# Patient Record
Sex: Female | Born: 1960 | Race: White | Hispanic: No | Marital: Married | Smoking: Never smoker
Health system: Southern US, Community
[De-identification: ages and names within clinical notes are randomized; demographics above are authoritative.]

---

## 1999-03-06 ENCOUNTER — Other Ambulatory Visit: Admission: RE | Admit: 1999-03-06 | Discharge: 1999-03-06 | Payer: Self-pay | Admitting: Obstetrics and Gynecology

## 1999-10-12 ENCOUNTER — Other Ambulatory Visit: Admission: RE | Admit: 1999-10-12 | Discharge: 1999-10-12 | Payer: Self-pay | Admitting: Obstetrics and Gynecology

## 2000-01-07 ENCOUNTER — Encounter: Payer: Self-pay | Admitting: Gastroenterology

## 2000-01-07 ENCOUNTER — Ambulatory Visit (HOSPITAL_COMMUNITY): Admission: RE | Admit: 2000-01-07 | Discharge: 2000-01-07 | Payer: Self-pay | Admitting: Gastroenterology

## 2000-03-10 ENCOUNTER — Ambulatory Visit (HOSPITAL_COMMUNITY): Admission: RE | Admit: 2000-03-10 | Discharge: 2000-03-10 | Payer: Self-pay | Admitting: Obstetrics and Gynecology

## 2000-03-21 ENCOUNTER — Other Ambulatory Visit: Admission: RE | Admit: 2000-03-21 | Discharge: 2000-03-21 | Payer: Self-pay | Admitting: Obstetrics and Gynecology

## 2001-03-24 ENCOUNTER — Other Ambulatory Visit: Admission: RE | Admit: 2001-03-24 | Discharge: 2001-03-24 | Payer: Self-pay | Admitting: *Deleted

## 2002-05-09 ENCOUNTER — Other Ambulatory Visit: Admission: RE | Admit: 2002-05-09 | Discharge: 2002-05-09 | Payer: Self-pay | Admitting: *Deleted

## 2002-05-18 ENCOUNTER — Observation Stay (HOSPITAL_COMMUNITY): Admission: RE | Admit: 2002-05-18 | Discharge: 2002-05-19 | Payer: Self-pay | Admitting: Orthopedic Surgery

## 2003-06-05 ENCOUNTER — Other Ambulatory Visit: Admission: RE | Admit: 2003-06-05 | Discharge: 2003-06-05 | Payer: Self-pay | Admitting: *Deleted

## 2009-12-12 ENCOUNTER — Encounter (INDEPENDENT_AMBULATORY_CARE_PROVIDER_SITE_OTHER): Payer: Self-pay | Admitting: *Deleted

## 2010-06-02 NOTE — Letter (Signed)
Summary: Colonoscopy Letter   Gastroenterology  [address] [place], [place] [address]   Phone: [phone]  Fax: [phone]      December 12, 2009 MRN: [id number]   [facility] [address] [place], [place]  [address]   Dear Ms. Vanalstine,   According to your medical record, it is time for you to schedule a Colonoscopy. The American Cancer Society recommends this procedure as a method to detect early colon cancer. Patients with a family history of colon cancer, or a personal history of colon polyps or inflammatory bowel disease are at increased risk.  This letter has beeen generated based on the recommendations made at the time of your procedure. If you feel that in your particular situation this may no longer apply, please contact our office.  Please call our office at [phone] to schedule this appointment or to update your records at your earliest convenience.  Thank you for cooperating with [place] to provide you with the very best care possible.   Sincerely,  Judie Petit T. Russella Dar, M.D.  [facility] Gastroenterology Division [phone]

## 2010-08-24 ENCOUNTER — Ambulatory Visit (INDEPENDENT_AMBULATORY_CARE_PROVIDER_SITE_OTHER): Payer: Self-pay

## 2010-08-24 ENCOUNTER — Inpatient Hospital Stay (INDEPENDENT_AMBULATORY_CARE_PROVIDER_SITE_OTHER)
Admission: RE | Admit: 2010-08-24 | Discharge: 2010-08-24 | Disposition: A | Discharge status: Home / Self Care | Payer: Self-pay | Source: Ambulatory Visit | Attending: Family Medicine | Admitting: Family Medicine

## 2010-08-24 DIAGNOSIS — S93609A Unspecified sprain of unspecified foot, initial encounter: Secondary | ICD-10-CM

## 2012-01-05 ENCOUNTER — Encounter: Payer: Self-pay | Admitting: Gastroenterology

## 2012-09-06 ENCOUNTER — Emergency Department (INDEPENDENT_AMBULATORY_CARE_PROVIDER_SITE_OTHER)
Admission: EM | Admit: 2012-09-06 | Discharge: 2012-09-06 | Disposition: A | Discharge status: Home / Self Care | Payer: Self-pay | Source: Home / Self Care | Attending: Family Medicine | Admitting: Family Medicine

## 2012-09-06 ENCOUNTER — Encounter (HOSPITAL_COMMUNITY): Payer: Self-pay

## 2012-09-06 ENCOUNTER — Emergency Department (INDEPENDENT_AMBULATORY_CARE_PROVIDER_SITE_OTHER): Payer: Self-pay

## 2012-09-06 DIAGNOSIS — S92919A Unspecified fracture of unspecified toe(s), initial encounter for closed fracture: Secondary | ICD-10-CM

## 2012-09-06 DIAGNOSIS — S92912A Unspecified fracture of left toe(s), initial encounter for closed fracture: Secondary | ICD-10-CM

## 2012-09-06 MED ORDER — TRAMADOL HCL 50 MG PO TABS: 50.0000 mg | ORAL_TABLET | Freq: Three times a day (TID) | ORAL | Status: AC | PRN

## 2012-09-06 MED ORDER — IBUPROFEN 600 MG PO TABS: 600.0000 mg | ORAL_TABLET | Freq: Three times a day (TID) | ORAL | Status: AC

## 2012-09-06 NOTE — ED Provider Notes (Signed)
History     CSN: [id number]  Arrival date & time 09/06/12  1346   First MD Initiated Contact with Patient 09/06/12 1524      Chief Complaint  Patient presents with  . Foot Injury    (Consider location/radiation/quality/duration/timing/severity/associated sxs/prior treatment) HPI Comments: 52 year old female with no significant past medical history. Comments complaining of left foot pain for 2 days. Patient reports she injured her left foot 2 days ago. She was feeding her couch and got up abruptly turning toward the left side scratching her left foot toes against the hard floor.  there was a deformity on her third toe that she was able to reduce herself. Area has been bruised and tender since. She's been using a rigid soled shoe she had from prior injury. Taking over-the-counter ibuprofen with some improvement. Denies numbness or paresthesias in her left foot.    History reviewed. No pertinent past medical history.  History reviewed. No pertinent past surgical history.  History reviewed. No pertinent family history.  History  Substance Use Topics  . Smoking status: Not on file  . Smokeless tobacco: Not on file  . Alcohol Use: Not on file    OB History   Grav Para Term Preterm Abortions TAB SAB Ect Mult Living                  Review of Systems  Constitutional: Negative for fever and chills.  Musculoskeletal:       Left foot injury as per HPI  Skin: Negative for wound.    Allergies  Review of patient's allergies indicates no known allergies.  Home Medications   Current Outpatient Rx  Name  Route  Sig  Dispense  Refill  . ibuprofen (ADVIL,MOTRIN) 600 MG tablet   Oral   Take 1 tablet (600 mg total) by mouth 3 (three) times daily.   30 tablet   0   . traMADol (ULTRAM) 50 MG tablet   Oral   Take 1 tablet (50 mg total) by mouth every 8 (eight) hours as needed for pain.   20 tablet   0     BP 148/84  Pulse 66  Temp(Src) 98.3 F (36.8 C) (Oral)  Resp 16  SpO2  98%  Physical Exam  Nursing note and vitals reviewed. Constitutional: She is oriented to person, place, and time. She appears well-developed and well-nourished. No distress.  HENT:  Head: Normocephalic and atraumatic.  Cardiovascular: Normal heart sounds.   Pulmonary/Chest: Breath sounds normal.  Musculoskeletal:  Left foot: there is bruising and mild swelling at dorsum of MPJ and proximal phalange at level of third toe. No swelling,  focal tenderness or deformity over tarsal or metatarsal bones. Patient able to wiggle flex and extend toes including third. No skin brakes or wounds. Entire left foot appears neurovascularly intact.   Neurological: She is alert and oriented to person, place, and time.  Skin: No rash noted. She is not diaphoretic.    ED Course  Procedures (including critical care time)  Labs Reviewed - No data to display Dg Foot Complete Left  09/06/2012  *RADIOLOGY REPORT*  Clinical Data: Deformity and bruising.  Recent dislocation of the toe.  LEFT FOOT - COMPLETE 3+ VIEW  Comparison: Three views of the left foot 08/24/2010.  Findings: An oblique fracture in the proximal phalanx of the third digit is minimally-displaced.  The PIP joint is located.  No additional fractures are evident.  The remainder of the foot is within normal limits.  IMPRESSION:  1.  Minimally-displaced fracture through the proximal phalanx in the third digit.   Original Report Authenticated By: Marin Roberts, M.D.      1. Phalanges fracture, foot, left, closed, initial encounter       MDM  Nondisplaced fracture of the proximal function of the third left toe. Body taping was performed here and recommended to continue to use rigid soled shoe for 4-6 weeks. Prescribed ibuprofen and tramadol. Asked to followup with orthopedic specialist and contact information was provided.        Sharin Grave, MD 09/08/12 1005

## 2012-09-06 NOTE — ED Notes (Signed)
States she injured her left foot couple of days ago,and self treated by reducing deformity , ice, elevation, wooden shoe (from prior injury), motrin. Concern for swelling , echymosis , as her job requires her to be up on her feet all the time

## 2016-02-06 ENCOUNTER — Emergency Department (HOSPITAL_COMMUNITY): Payer: Worker's Compensation

## 2016-02-06 ENCOUNTER — Encounter (HOSPITAL_COMMUNITY): Payer: Self-pay

## 2016-02-06 ENCOUNTER — Emergency Department (HOSPITAL_COMMUNITY)
Admission: EM | Admit: 2016-02-06 | Discharge: 2016-02-06 | Disposition: A | Discharge status: Home / Self Care | Payer: Worker's Compensation | Attending: Emergency Medicine | Admitting: Emergency Medicine

## 2016-02-06 DIAGNOSIS — S52612A Displaced fracture of left ulna styloid process, initial encounter for closed fracture: Secondary | ICD-10-CM | POA: Insufficient documentation

## 2016-02-06 DIAGNOSIS — Y9389 Activity, other specified: Secondary | ICD-10-CM | POA: Insufficient documentation

## 2016-02-06 DIAGNOSIS — Y990 Civilian activity done for income or pay: Secondary | ICD-10-CM | POA: Diagnosis not present

## 2016-02-06 DIAGNOSIS — Y929 Unspecified place or not applicable: Secondary | ICD-10-CM | POA: Insufficient documentation

## 2016-02-06 DIAGNOSIS — S52501A Unspecified fracture of the lower end of right radius, initial encounter for closed fracture: Secondary | ICD-10-CM | POA: Diagnosis not present

## 2016-02-06 DIAGNOSIS — W109XXA Fall (on) (from) unspecified stairs and steps, initial encounter: Secondary | ICD-10-CM | POA: Insufficient documentation

## 2016-02-06 DIAGNOSIS — S62101A Fracture of unspecified carpal bone, right wrist, initial encounter for closed fracture: Secondary | ICD-10-CM

## 2016-02-06 DIAGNOSIS — S6991XA Unspecified injury of right wrist, hand and finger(s), initial encounter: Secondary | ICD-10-CM | POA: Diagnosis present

## 2016-02-06 MED ORDER — MORPHINE SULFATE (PF) 4 MG/ML IV SOLN
4.0000 mg | Freq: Once | INTRAVENOUS | Status: AC
Start: 2016-02-06 — End: 2016-02-06
  Administered 2016-02-06: 4 mg via INTRAVENOUS
  Filled 2016-02-06: qty 1

## 2016-02-06 MED ORDER — ONDANSETRON HCL 4 MG/2ML IJ SOLN
4.0000 mg | Freq: Once | INTRAMUSCULAR | Status: AC
  Administered 2016-02-06: 4 mg via INTRAVENOUS
  Filled 2016-02-06: qty 2

## 2016-02-06 MED ORDER — HYDROCODONE-ACETAMINOPHEN 5-325 MG PO TABS: 2.0000 | ORAL_TABLET | ORAL | 0 refills | Status: AC | PRN

## 2016-02-06 NOTE — ED Triage Notes (Addendum)
Pt presents to ED c/o R wrist pain and injury that occurred ~30 mins. Pt reports she fell down steps and caught herself on her R wrist. Pt also reports R shoulder and knee pain. Deformity noted to R wrist. R radial pulse 2+, cap refill < 3 secs. Sensation intact.

## 2016-02-06 NOTE — Discharge Instructions (Signed)
Call Dr. Mina MarbleWeingold for follow up appointment.

## 2016-02-06 NOTE — ED Provider Notes (Signed)
[facility]-EMERGENCY DEPT Provider Note   CSN: [id number] Arrival date & time: 02/06/16  0865     History   Chief Complaint Chief Complaint  Patient presents with  . Wrist Pain  . Arm Injury    HPI Wanda Cook is a 55 y.o. female. She presents for evaluation of a wrist injury. She was at work. She was walking down 3 steps. When from the second to the last step she tripped and fell forward. She fell onto her knees and an outstretched right hand. Planes of pain at the right wrist. Initially complained of pain to elbow and shoulder. Now complains only of pain and swelling at the wrist  HPI  History reviewed. No pertinent past medical history.  There are no active problems to display for this patient.   History reviewed. No pertinent surgical history.  OB History    No data available       Home Medications    Prior to Admission medications   Medication Sig Start Date End Date Taking? Authorizing Provider  HYDROcodone-acetaminophen (NORCO/VICODIN) 5-325 MG tablet Take 2 tablets by mouth every 4 (four) hours as needed. 02/06/16   Rolland Porter, MD  ibuprofen (ADVIL,MOTRIN) 600 MG tablet Take 1 tablet (600 mg total) by mouth 3 (three) times daily. 09/06/12   Adlih Moreno-Coll, MD  traMADol (ULTRAM) 50 MG tablet Take 1 tablet (50 mg total) by mouth every 8 (eight) hours as needed for pain. 09/06/12   Sharin Grave, MD    Family History No family history on file.  Social History Social History  Substance Use Topics  . Smoking status: Never Smoker  . Smokeless tobacco: Never Used  . Alcohol use 1.2 oz/week    2 Glasses of wine per week     Allergies   Sulfa antibiotics   Review of Systems Review of Systems  Constitutional: Negative for appetite change, chills, diaphoresis, fatigue and fever.  HENT: Negative for mouth sores, sore throat and trouble swallowing.   Eyes: Negative for visual disturbance.  Respiratory: Negative for cough, chest tightness, shortness of  breath and wheezing.   Cardiovascular: Negative for chest pain.  Gastrointestinal: Negative for abdominal distention, abdominal pain, diarrhea, nausea and vomiting.  Endocrine: Negative for polydipsia, polyphagia and polyuria.  Genitourinary: Negative for dysuria, frequency and hematuria.  Musculoskeletal: Negative for gait problem.       Right wrist pain  Skin: Negative for color change, pallor and rash.  Neurological: Negative for dizziness, syncope, light-headedness and headaches.  Hematological: Does not bruise/bleed easily.  Psychiatric/Behavioral: Negative for behavioral problems and confusion.     Physical Exam Updated Vital Signs BP 155/79   Pulse 60   Temp 98.1 F (36.7 C) (Oral)   Resp 18   Ht 5\' 3"  (1.6 m)   Wt 130 lb (59 kg)   SpO2 100%   BMI 23.03 kg/m   Physical Exam  Constitutional: She is oriented to person, place, and time. She appears well-developed and well-nourished. No distress.  HENT:  Head: Normocephalic.  Eyes: Conjunctivae are normal. Pupils are equal, round, and reactive to light. No scleral icterus.  Neck: Normal range of motion. Neck supple. No thyromegaly present.  Cardiovascular: Normal rate and regular rhythm.  Exam reveals no gallop and no friction rub.   No murmur heard. Pulmonary/Chest: Effort normal and breath sounds normal. No respiratory distress. She has no wheezes. She has no rales.  Abdominal: Soft. Bowel sounds are normal. She exhibits no distension. There is no tenderness. There  is no rebound.  Musculoskeletal: Normal range of motion.       Arms: Neurological: She is alert and oriented to person, place, and time.  Skin: Skin is warm and dry. No rash noted.  Psychiatric: She has a normal mood and affect. Her behavior is normal.     ED Treatments / Results  Labs (all labs ordered are listed, but only abnormal results are displayed) Labs Reviewed - No data to display  EKG  EKG Interpretation None       Radiology Dg  Shoulder Right  Result Date: 02/06/2016 CLINICAL DATA:  Fall. EXAM: RIGHT SHOULDER - 2+ VIEW COMPARISON:  No recent . FINDINGS: Acromioclavicular and coracoclavicular separation cannot be excluded. AP views of both shoulders with without weights can be obtained for further evaluation . IMPRESSION: Acromioclavicular coracoid clavicular separation cannot be excluded. AP views of both shoulders with without weights can be obtained. No fracture or dislocation. Electronically Signed   By: Maisie Fushomas  Register   On: 02/06/2016 11:03   Dg Elbow Complete Right  Result Date: 02/06/2016 CLINICAL DATA:  Follow-up EXAM: RIGHT ELBOW - COMPLETE 3+ VIEW COMPARISON:  No recent prior. FINDINGS: No acute bony or joint abnormality identified. No evidence of fracture or dislocation. IMPRESSION: No acute or focal abnormality. Electronically Signed   By: Maisie Fushomas  Register   On: 02/06/2016 11:04   Dg Wrist Complete Right  Result Date: 02/06/2016 CLINICAL DATA:  Fall. EXAM: RIGHT WRIST - COMPLETE 3+ VIEW COMPARISON:  No recent prior. FINDINGS: Slightly displaced and angulated fractures of distal right radius with extension into the radiocarpal joint space noted. Slight displaced ulnar styloid fracture is noted. No other focal abnormality identified . IMPRESSION: 1. Slightly displaced and angulated fractures of the distal radius. Extension into the radiocarpal joint space noted. 2. Slight displaced fracture of the ulnar styloid. Electronically Signed   By: Maisie Fushomas  Register   On: 02/06/2016 11:02    Procedures Procedures (including critical care time)  Medications Ordered in ED Medications  ondansetron (ZOFRAN) injection 4 mg (4 mg Intravenous Given 02/06/16 1106)  morphine 4 MG/ML injection 4 mg (4 mg Intravenous Given 02/06/16 1107)     Initial Impression / Assessment and Plan / ED Course  I have reviewed the triage vital signs and the nursing notes.  Pertinent labs & imaging results that were available during my care of  the patient were reviewed by me and considered in my medical decision making (see chart for details).  Clinical Course    X-ray show a nondisplaced right distal radius and ulnar fracture. Placed in a coaptation splint. Placed in a sling. Discharged home. Has surgical follow-up. Vicodin for pain. Ice and elevate.  Final Clinical Impressions(s) / ED Diagnoses   Final diagnoses:  Closed fracture of right wrist, initial encounter    New Prescriptions Discharge Medication List as of 02/06/2016 11:31 AM    START taking these medications   Details  HYDROcodone-acetaminophen (NORCO/VICODIN) 5-325 MG tablet Take 2 tablets by mouth every 4 (four) hours as needed., Starting Fri 02/06/2016, Print         Rolland PorterMark Cornie Mccomber, MD 02/06/16 [phone]

## 2017-07-27 IMAGING — CR DG ELBOW COMPLETE 3+V*R*
4 series · 4 of 4 positions shown · non-contrast
Comparison: No recent prior.

CLINICAL DATA: Follow-up

EXAM:
RIGHT ELBOW - COMPLETE 3+ VIEW

[x elbow obl right (1 of 2)]
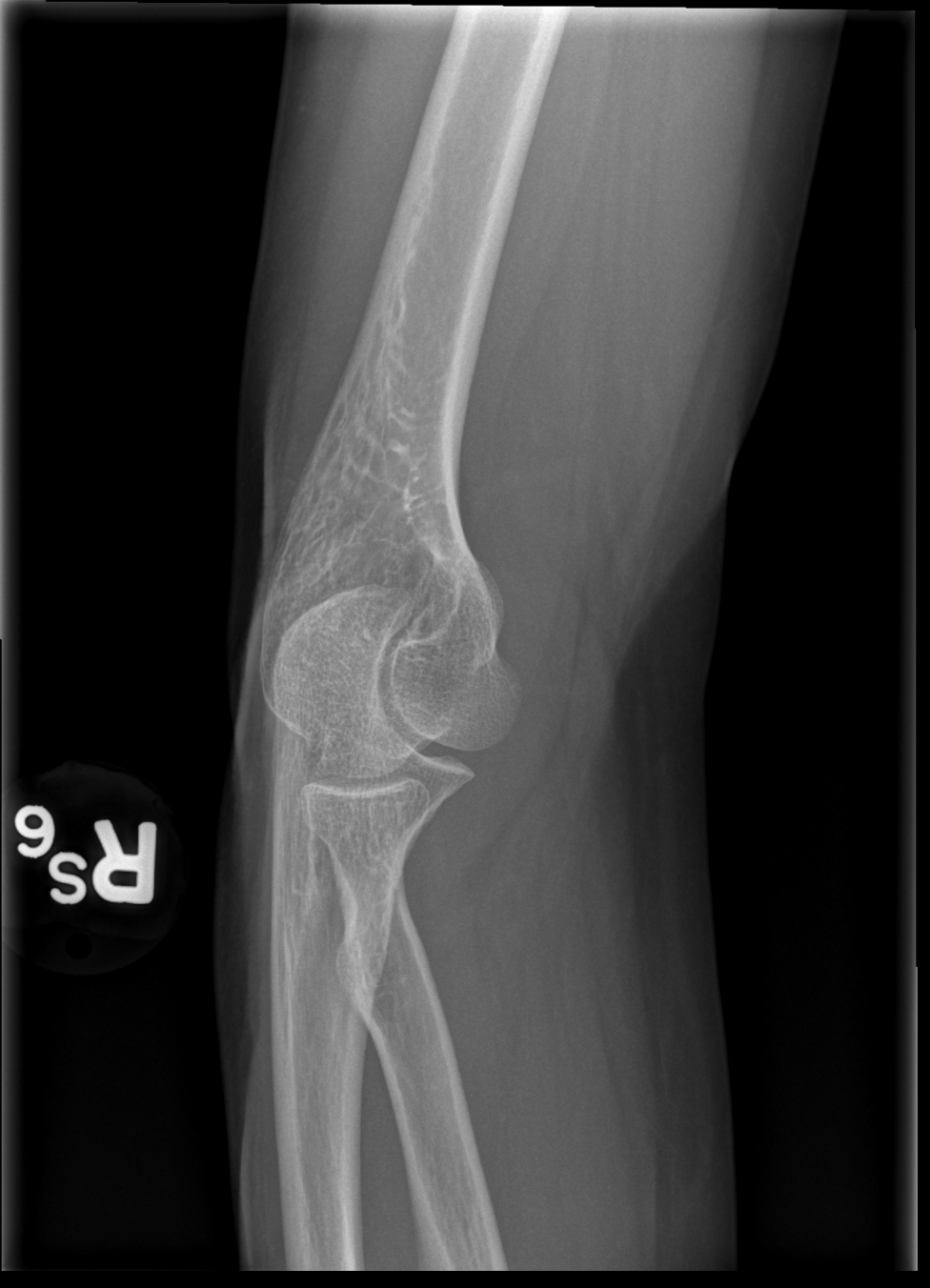

[x elbow ap right]
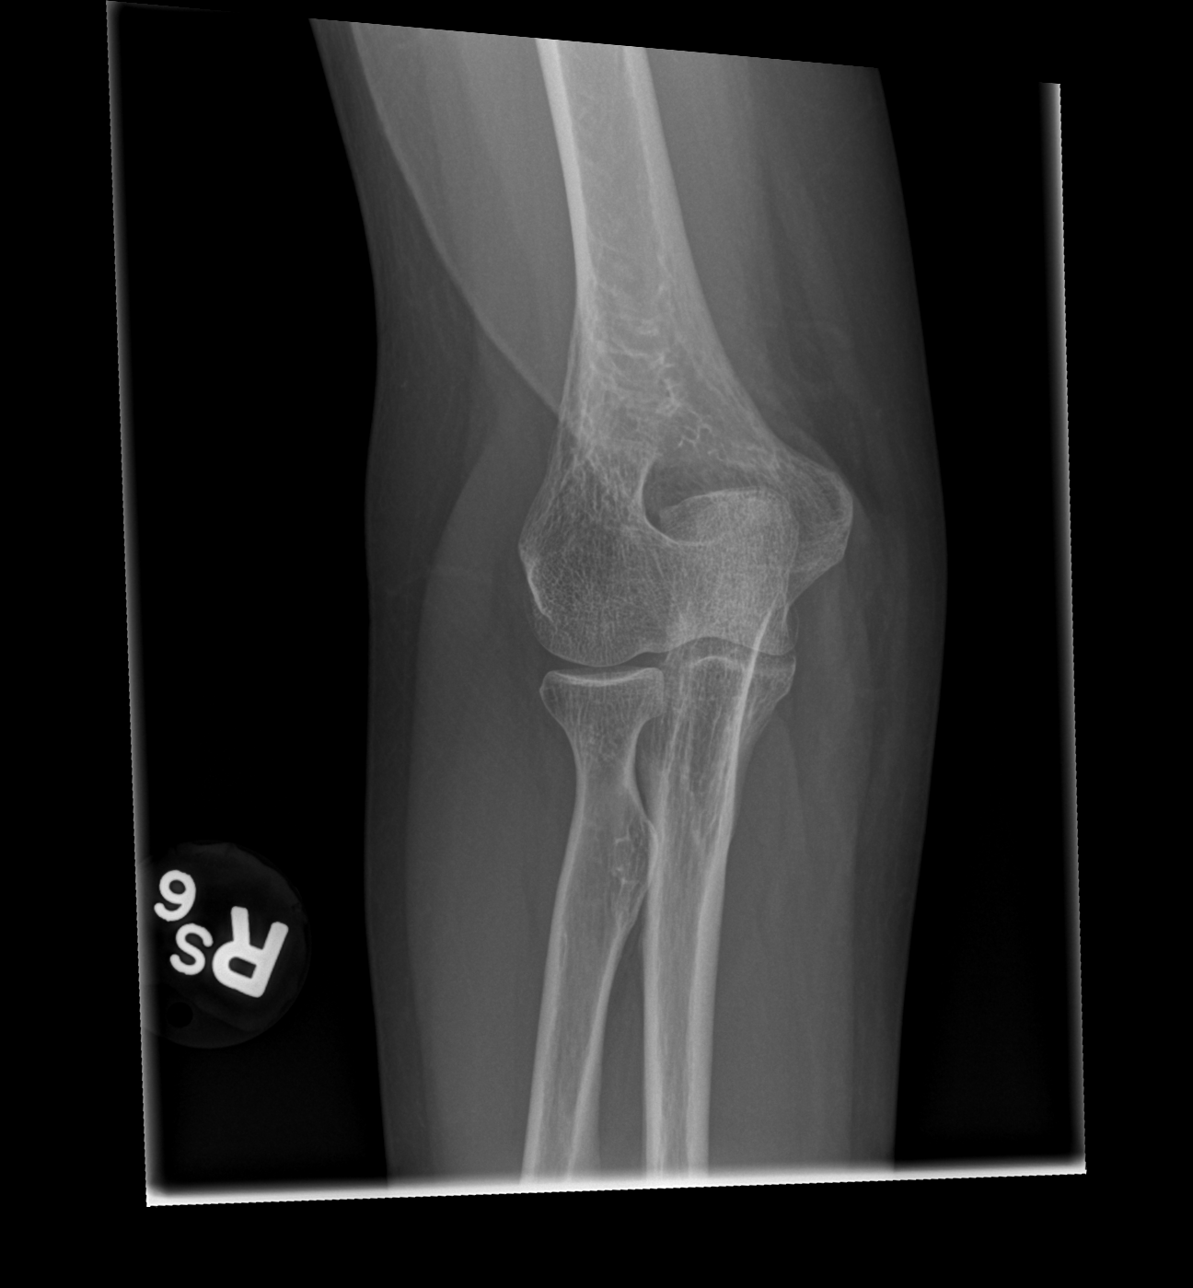

[x elbow obl right (2 of 2)]
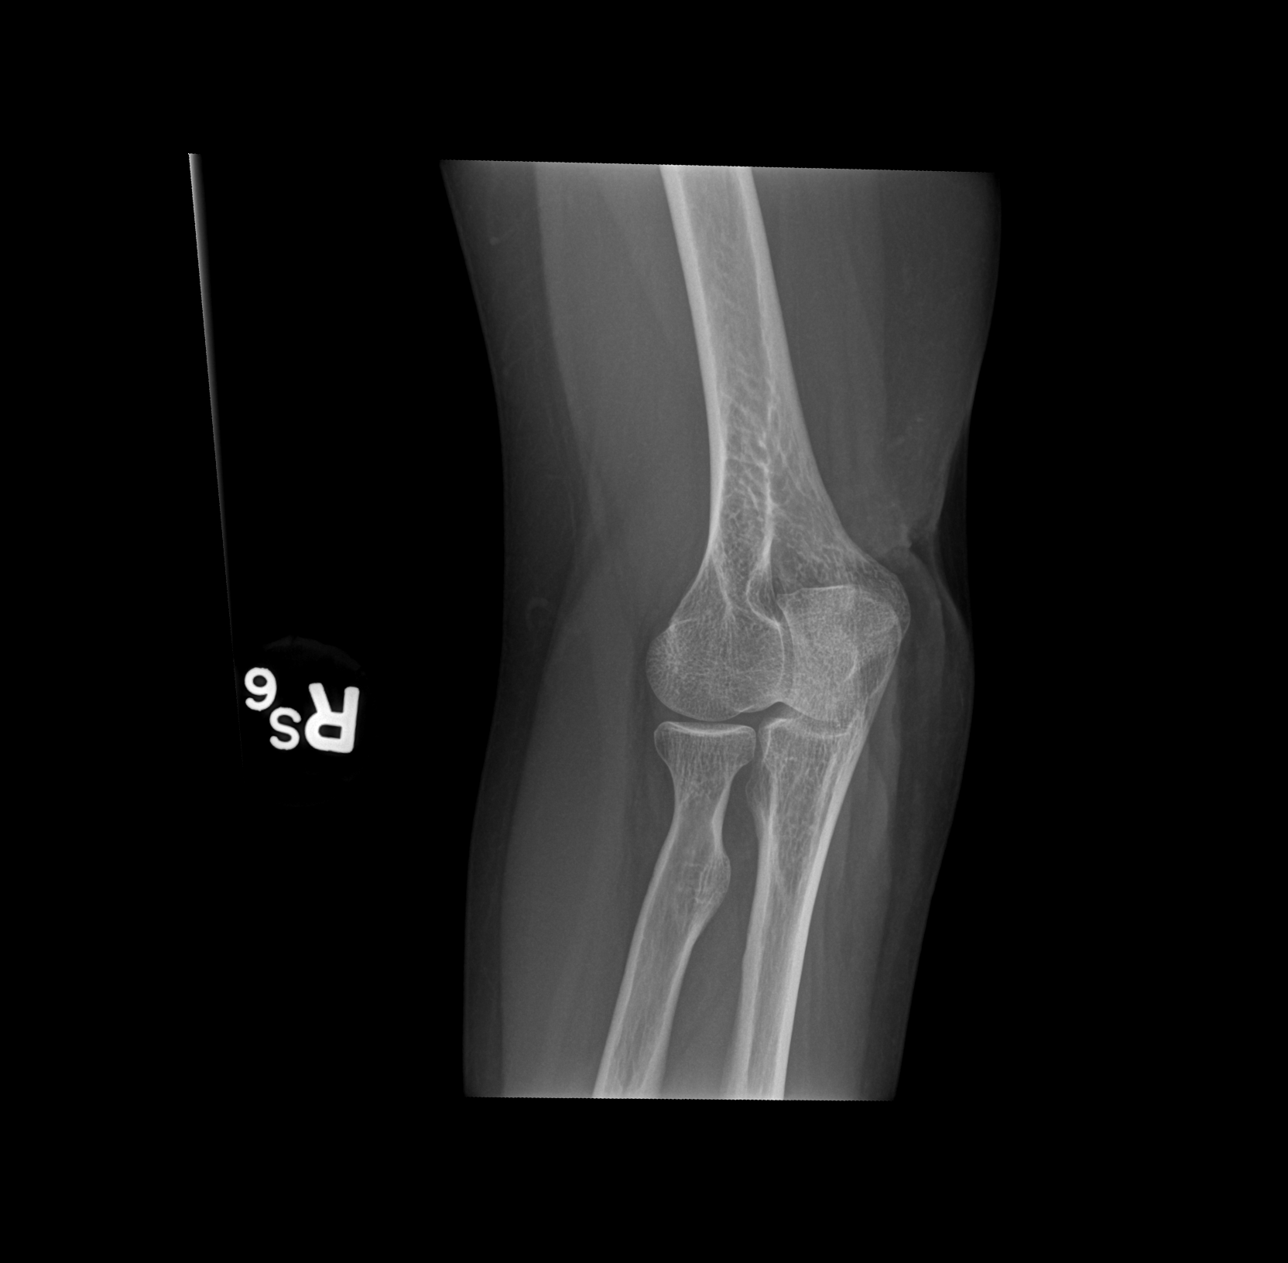

[x elbow lat right]
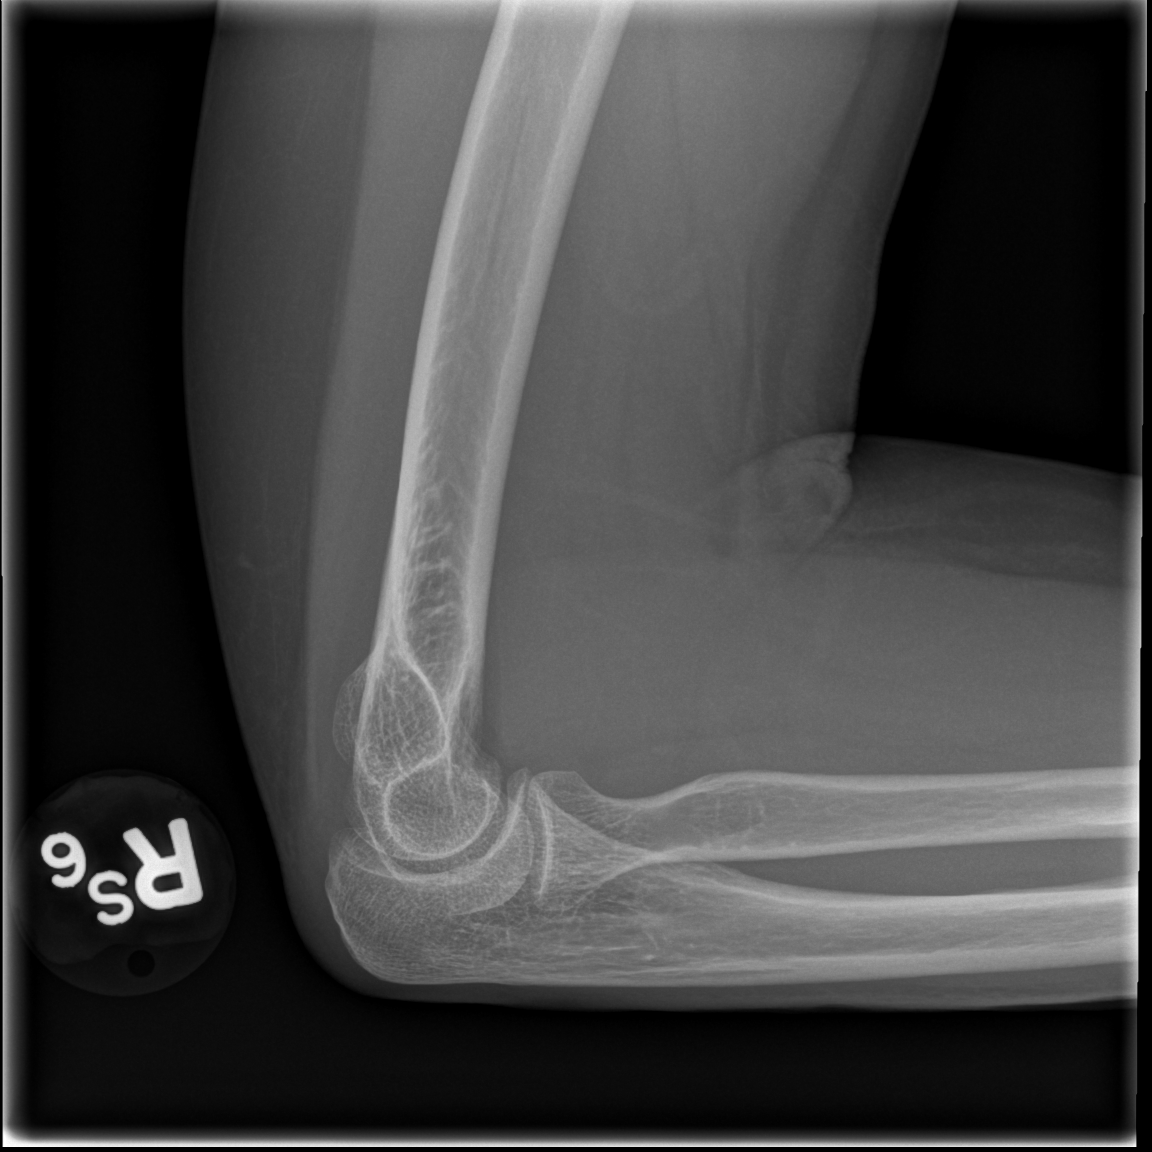

[4 of 4 positions shown; findings below may reference images not displayed]

FINDINGS: No acute bony or joint abnormality identified. No evidence of
fracture or dislocation.
IMPRESSION: No acute or focal abnormality.

## 2017-07-27 IMAGING — CR DG WRIST COMPLETE 3+V*R*
4 series · 4 of 4 positions shown · non-contrast
Comparison: No recent prior.

CLINICAL DATA: Fall.

EXAM:
RIGHT WRIST - COMPLETE 3+ VIEW

[x wrist pa right]
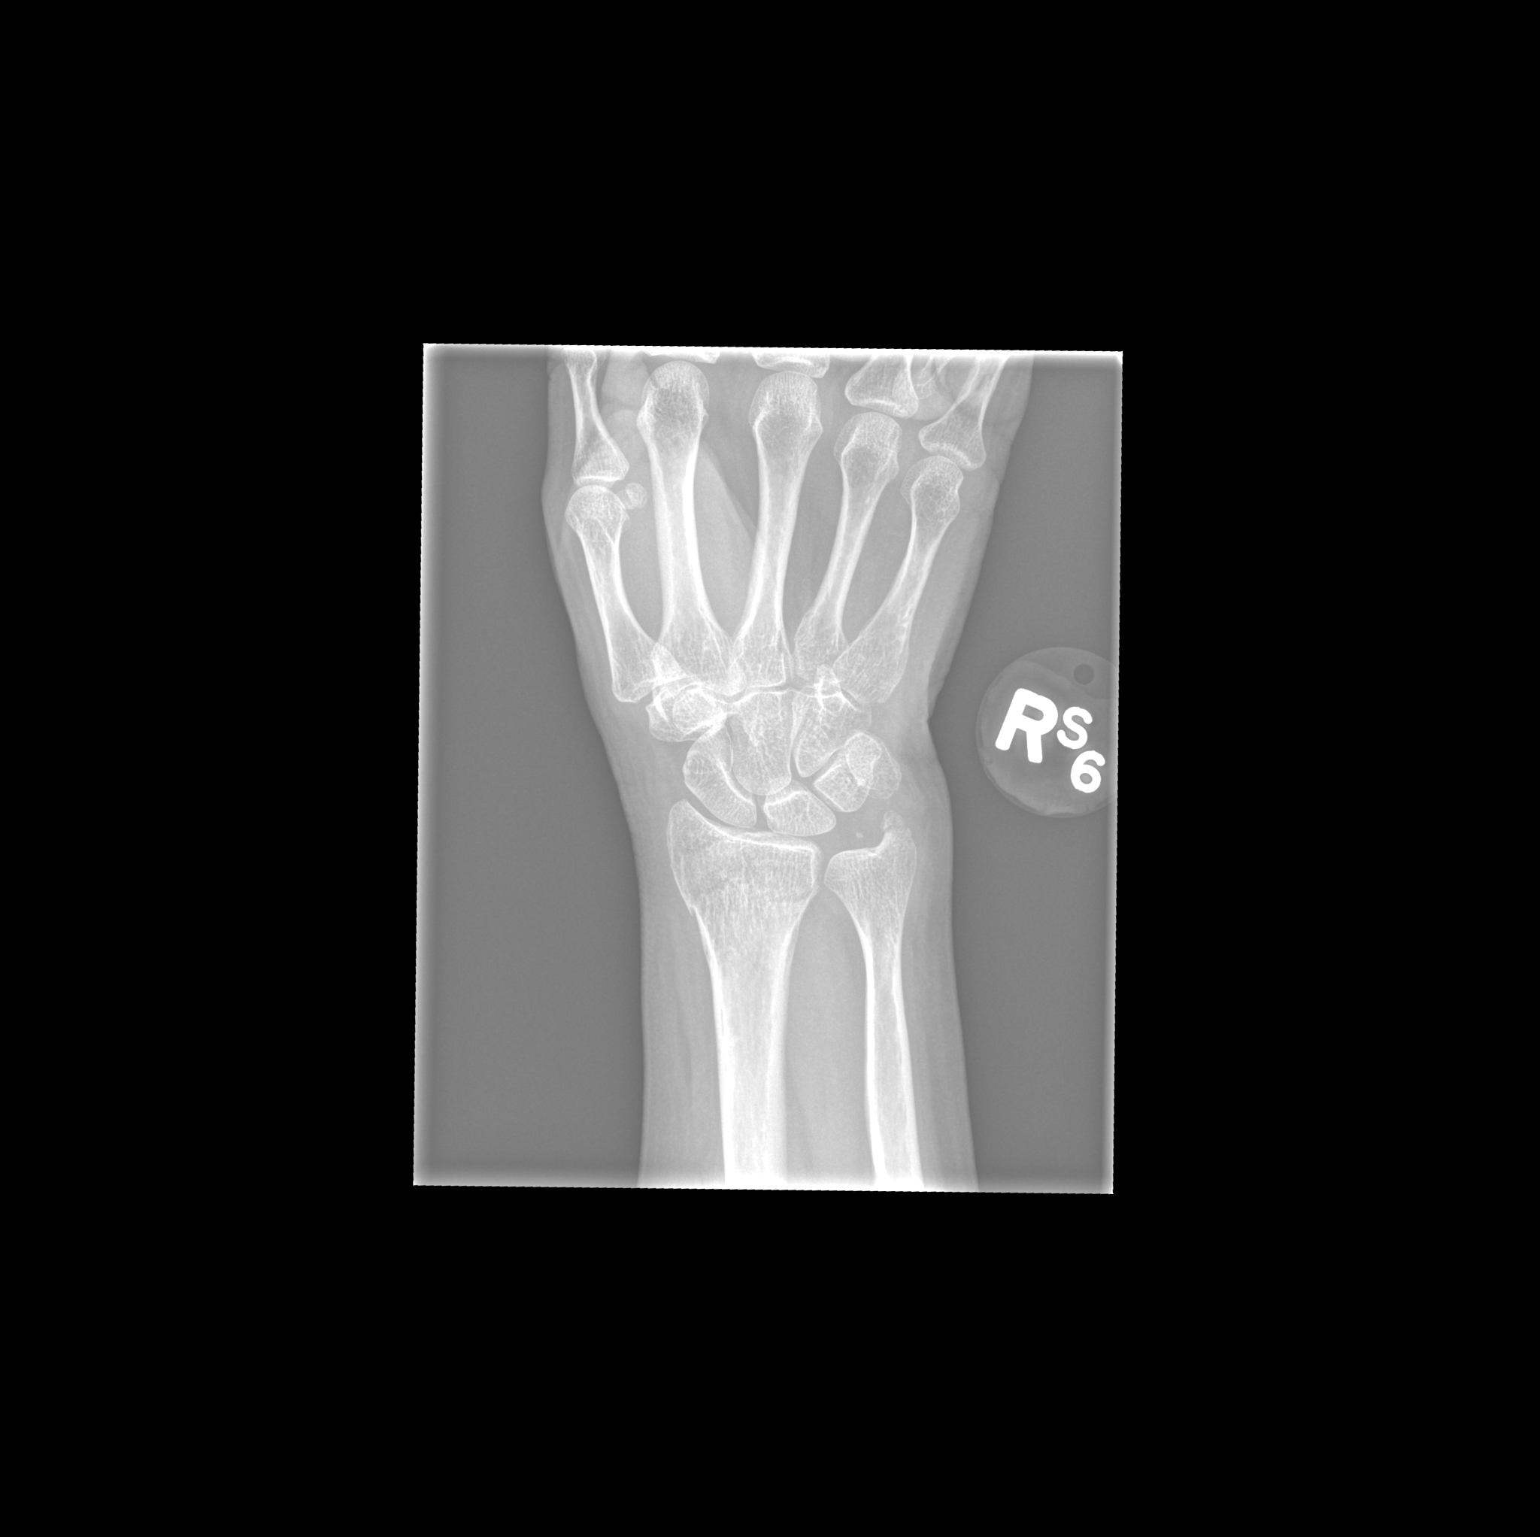

[x wrist obl right]
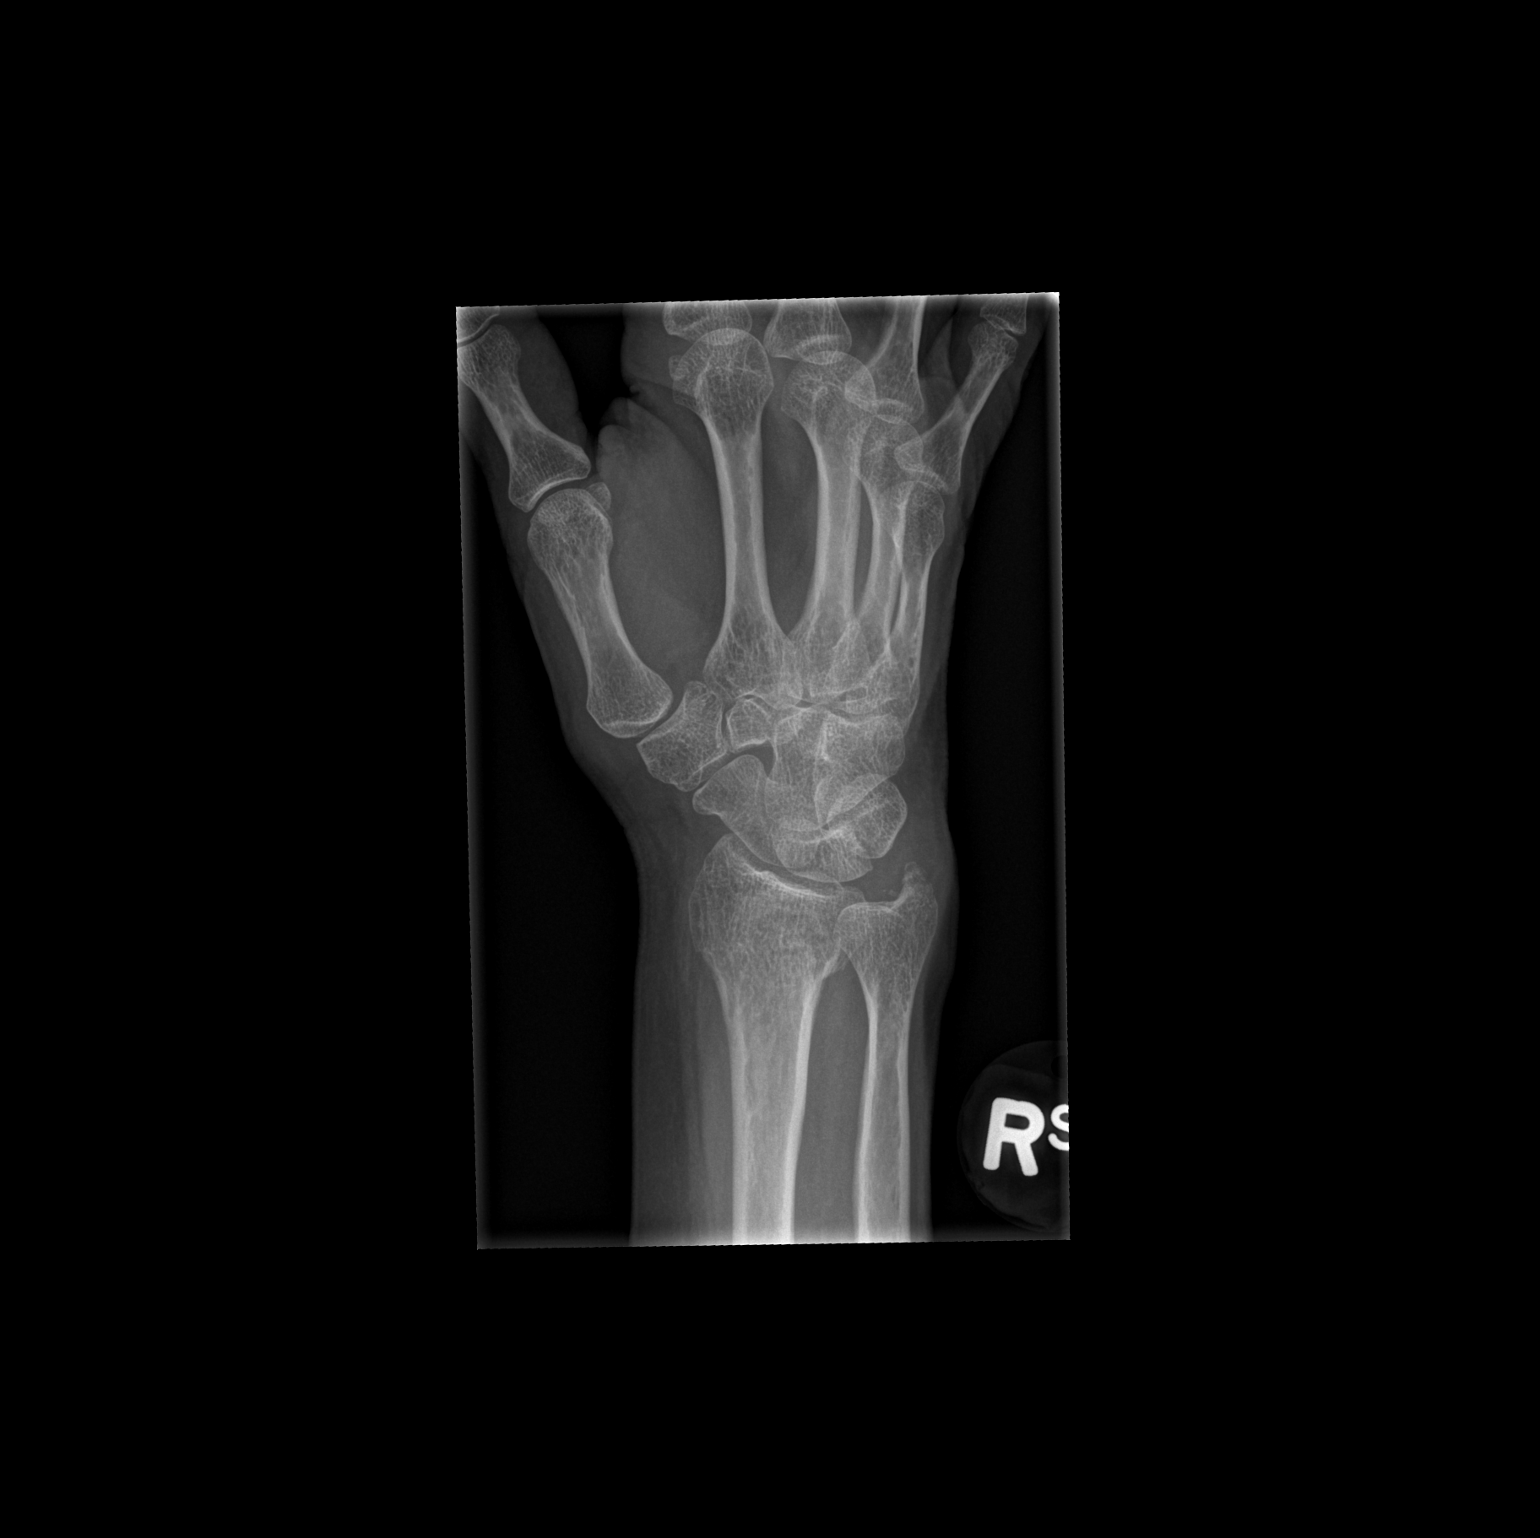

[x wrist lat right]
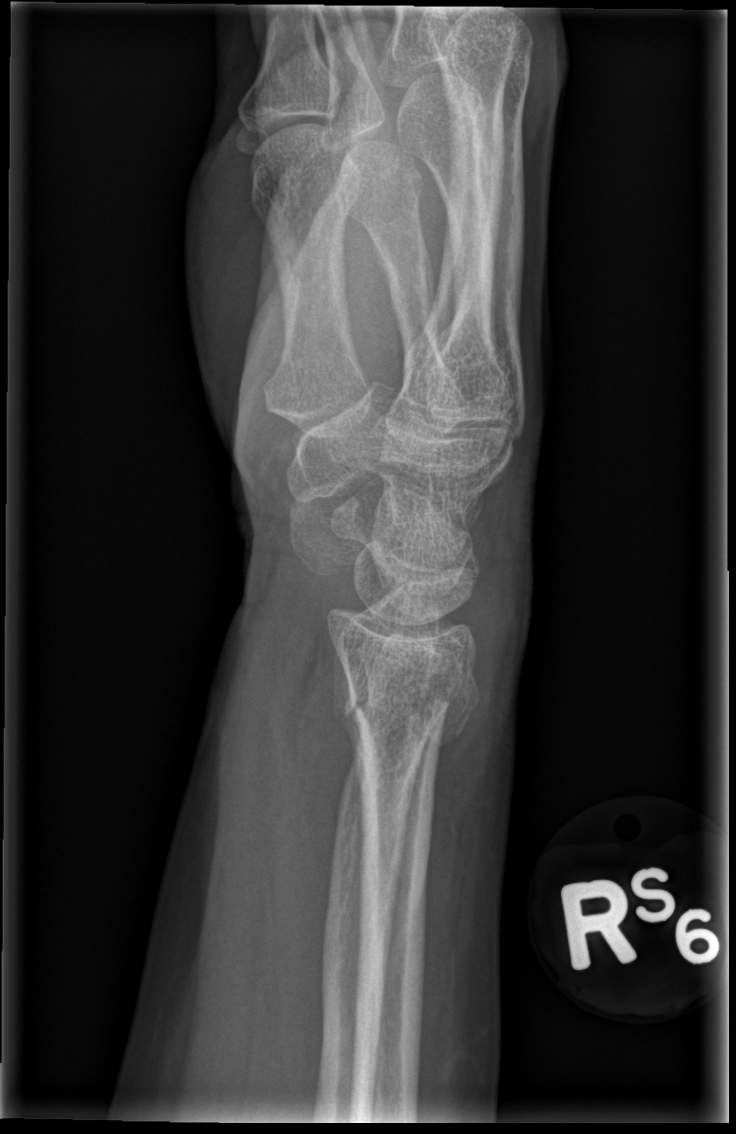

[x wrist navicular view right]
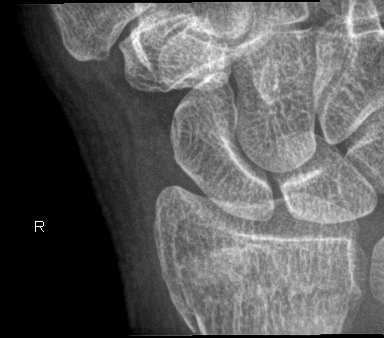

[4 of 4 positions shown; findings below may reference images not displayed]

FINDINGS: Slightly displaced and angulated fractures of distal right radius
with extension into the radiocarpal joint space noted. Slight
displaced ulnar styloid fracture is noted. No other focal
abnormality identified .
IMPRESSION: 1. Slightly displaced and angulated fractures of the distal radius.
Extension into the radiocarpal joint space noted.

2. Slight displaced fracture of the ulnar styloid.
